# Patient Record
Sex: Male | Born: 1995 | Hispanic: No | Marital: Single
Health system: Northeastern US, Academic
[De-identification: ages and names within clinical notes are randomized; demographics above are authoritative.]

---

## 2019-03-28 ENCOUNTER — Other Ambulatory Visit: Payer: Self-pay | Admitting: Neurology

## 2019-03-28 DIAGNOSIS — R292 Abnormal reflex: Secondary | ICD-10-CM

## 2019-04-12 ENCOUNTER — Other Ambulatory Visit: Payer: Self-pay

## 2019-04-12 ENCOUNTER — Ambulatory Visit: Payer: BLUE CROSS/BLUE SHIELD

## 2019-04-12 ENCOUNTER — Ambulatory Visit
Admission: RE | Admit: 2019-04-12 | Discharge: 2019-04-12 | Disposition: A | Discharge status: Home / Self Care | Payer: BLUE CROSS/BLUE SHIELD | Source: Ambulatory Visit | Attending: Neurology | Admitting: Neurology

## 2019-04-12 DIAGNOSIS — R292 Abnormal reflex: Secondary | ICD-10-CM | POA: Diagnosis not present

## 2019-05-16 ENCOUNTER — Ambulatory Visit: Payer: BLUE CROSS/BLUE SHIELD

## 2019-05-29 ENCOUNTER — Other Ambulatory Visit: Payer: Self-pay

## 2019-05-29 ENCOUNTER — Ambulatory Visit
Admission: RE | Admit: 2019-05-29 | Discharge: 2019-05-29 | Disposition: A | Discharge status: Home / Self Care | Payer: BLUE CROSS/BLUE SHIELD | Source: Ambulatory Visit | Attending: Neurology | Admitting: Neurology

## 2019-05-29 DIAGNOSIS — R292 Abnormal reflex: Secondary | ICD-10-CM

## 2019-09-05 ENCOUNTER — Other Ambulatory Visit: Payer: Self-pay | Admitting: Neurology

## 2019-09-05 DIAGNOSIS — M5416 Radiculopathy, lumbar region: Secondary | ICD-10-CM

## 2019-09-11 ENCOUNTER — Other Ambulatory Visit: Payer: Self-pay

## 2019-09-11 ENCOUNTER — Ambulatory Visit
Admission: RE | Admit: 2019-09-11 | Discharge: 2019-09-11 | Disposition: A | Discharge status: Home / Self Care | Payer: BLUE CROSS/BLUE SHIELD | Source: Ambulatory Visit | Attending: Neurology | Admitting: Neurology

## 2019-09-11 DIAGNOSIS — M5416 Radiculopathy, lumbar region: Secondary | ICD-10-CM | POA: Diagnosis not present

## 2019-09-22 ENCOUNTER — Other Ambulatory Visit: Payer: Self-pay | Admitting: Rheumatology

## 2019-09-22 DIAGNOSIS — D869 Sarcoidosis, unspecified: Secondary | ICD-10-CM

## 2019-10-03 ENCOUNTER — Ambulatory Visit
Admission: RE | Admit: 2019-10-03 | Discharge: 2019-10-03 | Disposition: A | Discharge status: Home / Self Care | Payer: BLUE CROSS/BLUE SHIELD | Source: Ambulatory Visit | Attending: Rheumatology | Admitting: Rheumatology

## 2019-10-03 ENCOUNTER — Other Ambulatory Visit: Payer: Self-pay

## 2019-10-03 DIAGNOSIS — D869 Sarcoidosis, unspecified: Secondary | ICD-10-CM | POA: Diagnosis not present

## 2020-04-16 ENCOUNTER — Other Ambulatory Visit: Payer: Self-pay

## 2020-04-16 ENCOUNTER — Encounter: Payer: Self-pay | Admitting: Emergency Medicine

## 2020-04-16 ENCOUNTER — Emergency Department
Admission: EM | Admit: 2020-04-16 | Discharge: 2020-04-16 | Disposition: A | Discharge status: Home / Self Care | Payer: BLUE CROSS/BLUE SHIELD | Attending: Emergency Medicine | Admitting: Emergency Medicine

## 2020-04-16 DIAGNOSIS — R05 Cough: Secondary | ICD-10-CM | POA: Insufficient documentation

## 2020-04-16 DIAGNOSIS — B349 Viral infection, unspecified: Secondary | ICD-10-CM | POA: Diagnosis not present

## 2020-04-16 DIAGNOSIS — R0981 Nasal congestion: Secondary | ICD-10-CM | POA: Diagnosis not present

## 2020-04-16 DIAGNOSIS — Z20822 Contact with and (suspected) exposure to covid-19: Secondary | ICD-10-CM | POA: Diagnosis not present

## 2020-04-16 DIAGNOSIS — J029 Acute pharyngitis, unspecified: Secondary | ICD-10-CM | POA: Diagnosis present

## 2020-04-16 DIAGNOSIS — Z1152 Encounter for screening for COVID-19: Secondary | ICD-10-CM

## 2020-04-16 LAB — RESPIRATORY PANEL BY RT PCR (FLU A&B, COVID)
Influenza A by PCR: NEGATIVE
Influenza B by PCR: NEGATIVE
SARS Coronavirus 2 by RT PCR: NEGATIVE

## 2020-04-16 MED ORDER — PSEUDOEPH-BROMPHEN-DM 30-2-10 MG/5ML PO SYRP: 5.0000 mL | ORAL_SOLUTION | Freq: Four times a day (QID) | ORAL | 0 refills | Status: AC | PRN

## 2020-04-16 NOTE — ED Provider Notes (Signed)
[facility] Emergency Department Provider Note  ____________________________________________  Time seen: Approximately 4:01 PM  I have reviewed the triage vital signs and the nursing notes.   HISTORY  Chief Complaint Sore Throat, Nasal Congestion, and Cough   HPI Lonnie Fuller is a 24 y.o. male presenting to the emergency department for treatment and evaluation of congestion, sore throat and general malaise since yesterday. 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 No relief with over the counter medications prior to arrival.    History reviewed. No pertinent past medical history.  There are no problems to display for this patient.   History reviewed. No pertinent surgical history.  Prior to Admission medications   Medication Sig Start Date End Date Taking? Authorizing Provider  brompheniramine-pseudoephedrine-DM 30-2-10 MG/5ML syrup Take 5 mLs by mouth 4 (four) times daily as needed. 04/16/20   Chinita Pester, FNP    Allergies Patient has no allergy information on record.  No family history on file.  Social History Social History   Tobacco Use  . Smoking status: Not on file  Substance Use Topics  . Alcohol use: Not on file  . Drug use: Not on file    Review of Systems Constitutional: Positive for fever/chills.  Normal appetite. ENT: Positive for sore throat. Cardiovascular: Denies chest pain. Respiratory: No shortness of breath.  Positive for cough.  Negative wheezing.  Gastrointestinal: Negative for nausea, no vomiting.  No diarrhea.  Musculoskeletal: Positive for body aches Skin: Negative for rash. Neurological: Positive for headaches ____________________________________________   PHYSICAL EXAM:  VITAL SIGNS: ED Triage Vitals  Enc Vitals Group     BP 04/16/20 1558 126/66     Pulse Rate 04/16/20 1558 (!) 101     Resp 04/16/20 1558 20     Temp 04/16/20 1558 98.5 F (36.9 C)     Temp Source 04/16/20 1558 Oral      SpO2 04/16/20 1558 95 %     Weight 04/16/20 1557 163 lb (73.9 kg)     Height 04/16/20 1557 5\' 10"  (1.778 m)     Head Circumference --      Peak Flow --      Pain Score 04/16/20 1556 8     Pain Loc --      Pain Edu? --      Excl. in GC? --     Constitutional: Alert and oriented.  Overall well appearing and in no acute distress. Eyes: Conjunctivae are normal. Ears: TMs normal Nose: Maxillary sinus congestion noted; no rhinnorhea. Mouth/Throat: Mucous membranes are moist.  Oropharynx erythematous. Tonsils without exudate. Uvula midline. Neck: No stridor.  Lymphatic: No cervical lymphadenopathy. Cardiovascular: Normal rate, regular rhythm. Good peripheral circulation. Respiratory: Respirations are even and unlabored.  No retractions.  Breath sounds clear to auscultation. Gastrointestinal: Soft and nontender.  Musculoskeletal: FROM x 4 extremities.  Neurologic:  Normal speech and language. Skin:  Skin is warm, dry and intact. No rash noted. Psychiatric: Mood and affect are normal. Speech and behavior are normal.  ____________________________________________   LABS (all labs ordered are listed, but only abnormal results are displayed)  Labs Reviewed  RESPIRATORY PANEL BY RT PCR (FLU A&B, COVID)   ____________________________________________  EKG  Not indicated ____________________________________________  RADIOLOGY  Not indicated ____________________________________________   PROCEDURES  Procedure(s) performed: None  Critical Care performed: No ____________________________________________   INITIAL IMPRESSION / ASSESSMENT AND PLAN / ED COURSE  24 y.o. male presenting to the emergency department for treatment and evaluation of  symptoms as described in the HPI.  Plan will be to test him for COVID-19, give him a school excuse, and prescribe Bromfed.  He is to follow-up with the student health clinic for symptoms that are not improving over the next few days.  He is to  return to the emergency department for symptoms change or worsen if unable to schedule an appointment.    Medications - No data to display  ED Discharge Orders         Ordered    brompheniramine-pseudoephedrine-DM 30-2-10 MG/5ML syrup  4 times daily PRN        04/16/20 1606           Pertinent labs & imaging results that were available during my care of the patient were reviewed by me and considered in my medical decision making (see chart for details).    If controlled substance prescribed during this visit, 12 month history viewed on the [facility] prior to issuing an initial prescription for Schedule II or III opiod. ____________________________________________   FINAL CLINICAL IMPRESSION(S) / ED DIAGNOSES  Final diagnoses:  Acute viral syndrome  Encounter for screening for COVID-19    Note:  This document was prepared using Dragon voice recognition software and may include unintentional dictation errors.    Chinita Pester, FNP 04/16/20 2114    Minna Antis, MD 04/19/20 1442

## 2020-04-16 NOTE — ED Notes (Signed)
Pt verbalizes understanding of d/c instructions and follow up. 

## 2020-04-16 NOTE — ED Triage Notes (Signed)
Pt reports runny nose, sore throat and a litle cough since yesterday

## 2020-08-01 ENCOUNTER — Encounter: Admit: 2020-08-01 | Payer: PRIVATE HEALTH INSURANCE | Attending: Anesthesiology | Primary: Pediatrics

## 2020-08-03 ENCOUNTER — Inpatient Hospital Stay: Admit: 2020-08-03 | Discharge: 2020-08-03 | Payer: BLUE CROSS/BLUE SHIELD | Primary: Pediatrics

## 2020-08-03 ENCOUNTER — Encounter: Admit: 2020-08-03 | Payer: PRIVATE HEALTH INSURANCE | Attending: Anesthesiology | Primary: Pediatrics

## 2020-08-03 DIAGNOSIS — M47812 Spondylosis without myelopathy or radiculopathy, cervical region: Secondary | ICD-10-CM

## 2020-08-03 DIAGNOSIS — G894 Chronic pain syndrome: Secondary | ICD-10-CM

## 2020-08-03 DIAGNOSIS — M47816 Spondylosis without myelopathy or radiculopathy, lumbar region: Secondary | ICD-10-CM

## 2020-08-03 DIAGNOSIS — G629 Polyneuropathy, unspecified: Secondary | ICD-10-CM

## 2020-08-03 MED ORDER — TRAZODONE 50 MG TABLET
50 mg | Status: SS
Start: 2020-08-03 — End: 2020-09-02

## 2020-08-03 MED ORDER — DULOXETINE 30 MG CAPSULE,DELAYED RELEASE
30 mg | Freq: Every day | Status: SS
Start: 2020-08-03 — End: 2020-09-28

## 2020-08-03 MED ORDER — NALTREXONE (BULK) 100 % POWDER
100 % | 1 refills | Status: SS
Start: 2020-08-03 — End: 2020-09-02

## 2020-08-04 NOTE — Progress Notes
[facility] for Pain Management  [facility]    Progress Note    Referred by: No referring provider defined for this encounter.  Time of service: 1130-1225pm  Chief Complaint: back pain  Father present at interview     Interval HPI:  I last saw en about 1 year ago. Since then he has seen several specialists in the area including [place] including Pain management, neurology, psychology/psychiatrist, Rheum, Genetics, ENT/Pul. He's had a lot of workup which essentially has been inconclusive. 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 He seems to have OSA which is impacting his sleep, mood and pain for which he will have ENT surgery to help resolve that. He was encouraged to continue cymbalta and Lyrica from his specialists at [place]. 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 Travis Conway's pain continues to get worse - he has whole body pain, in all major joints and it feels numb. His legs don't work when he walks. Cymbalta was restarted as it may have helped his mood although Travis Conway denies this. I discussed that Travis Conway should continue Cymbalta and titrate up as tolerated. I discussed going to licensed medical dispensary in [place] as MJ seems to help him. I offered trying Low dose Naltrexone for his chronic pain. In addition I offered lumbar epidural and MBB given that he has a disc protrusion in the back with the hopes to decrease some of the pain he has while his diagnostic workup continues otherwise.     Initial HPI  Travis Conway is a 24 y.o. male with PMHx of digeorge syndrome who presents for initial evaluation for chronic whole back pain. Patient was referred over from Dr. Noel Gerold from [facility]. Father present during interview. As per patient, pain became an issue about 2 years ago but thinks it gradually got worse from way longer than that. No inciting event, involves neck, upper back, mid thoracic and lumbar back. Also with knee, foot pain and sometimes arm pain. Over the years the pain burden has worsened. He has kept active during this time and does HEP/PT. His PCP did a slew of labs which all came back normal. He has a history of scoliosis. He referred to several orthopedists and neurologists with MRI done showing no gross abnormality. He denies headache, GI symptoms, fatigue but did have intentional weight loss of over 30 lbs over the last few years which his PCP knows about. 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 Denies radicular symptoms. Aside from PT, tried occasional Motrin and smokes MJ and vapes.       The patient denies bowel/bladder changes of retention or urgency  The patient denies numbness, denies tingling, and denies weakness.    PAIN DETAIL:  Onset: 2 years  Inciting Event: no If yes, Date of injury: no  Progression: since onset, pain has worsened   Timing: constant   Quality: ()Ache ()Burning  Radiating: upper back  Severity: 4-6/10  Associated numbness or weakness: no  Is Patient taking Opioid Medication:no  Exacerbating factors: activity   Alleviating factors: rest   Is Pain Level Acceptable?:     Current pain medications:  Cymbalta    Pain Relief Treatment Hx:   Medications:  Lyrica  Cymbalta  Muscle relaxant  Tramadol  NSAID    Interventions:  none  Adjuvants:  PT/HEP  Acupuncture     PAST MEDICAL HISTORY:  DiGeorge Syndrome    Past Psychiatric history  ADHD    PAST SURGICAL HISTORY:  No past surgical history on file.  No past surgical history pertinent negatives on file.    SOCIAL HISTORY:  Social History     Socioeconomic History   ? Marital status: Single     Spouse name: Not on file   ? Number of children: Not on file   ? Years of education: Not on file   ? Highest education level: Not on file   Occupational History   ? Not on file   Tobacco Use   ? Smoking status: Never Smoker   Substance and Sexual Activity   ? Alcohol use: Not on file Comment: occ   ? Drug use: Never   ? Sexual activity: Not on file   Other Topics Concern   ? Not on file   Social History Narrative   ? Not on file     Social Determinants of Health     Financial Resource Strain:    ? Difficulty of Paying Living Expenses:    Food Insecurity:    ? Worried About Programme researcher, broadcasting/film/video in the Last Year:    ? Barista in the Last Year:    Transportation Needs:    ? Freight forwarder (Medical):    ? Lack of Transportation (Non-Medical):    Physical Activity:    ? Days of Exercise per Week:    ? Minutes of Exercise per Session:    Stress:    ? Feeling of Stress :    Social Connections:    ? Frequency of Communication with Friends and Family:    ? Frequency of Social Gatherings with Friends and Family:    ? Attends Religious Services:    ? Active Member of Clubs or Organizations:    ? Attends Banker Meetings:    ? Marital Status:    Intimate Partner Violence:    ? Fear of Current or Ex-Partner:    ? Emotionally Abused:    ? Physically Abused:    ? Sexually Abused:      Social History     Social History Narrative   ? Not on file      Denies a previous problem with ETOH and/or Drugs.    FAMILY HISTORY:  History reviewed. No pertinent family history.  Reviewed, no history of chronic pain in parents, and is non-contributory    REVIEW OF SYSTEMS:  Constitutional: intentional weight loss   HEENT: +fatigue  Cardiovascular: Negative for chest pain or palpitations   Respiratory: Negative for cough, wheezing, dyspnea   Gastrointestinal: Negative for nausea, vomiting, constipation, diarrhea, or heartburn  Genitourinary: Negative for dysuria   Musculoskeletal: body aches   Skin: Negative for rashes   Neurologic: Negative for headache, paresthesias, numbness, focal weakness  Psychiatric: Negative for depression/anxiety; No suicidal ideation; No insomnia  Endocrine: Negative for polydipsia, cold/heat intolerance   Hematologic: Negative for spontaneous bruising    Physical Exam  There were no vitals filed for this visit.  There is no height or weight on file to calculate BMI.    Gen: NAD  Psych: mood appropriate for given condition  CV: 2+ radial pulse  HEENT: anicteric   Respiratory: non labored  Abd: soft nt, nd  Lymph: no lymphadenopathy in cervical or axillary regions  Skin: intact  Sensation: intact to lt  touch bilaterally in c4-t1   Reflexes: 2+ BR, Bicep, tricept  ROM: Cervical ROM full, shoulder, elbow and wrist ROM full, no scapular dysmotility   Tone:  Normal at elbow, wrist and shoulder   Inspection: no atrophy of bicep, FDI or APB noted, no scapular winging  Special tests: Kemp's positive bilaterally   Neer's Tinnel's negative at the wrist over median nerve.   Palpation: tender cervical paraspinals, levator scapula and trapezius non tender bicipital tendon  Motor:                  Right  Left  C4 Shoulder Abduction    5  5  C5 Elbow Flexion      5  5  C6 Wrist Extension    5  5  C7 Elbow Extension     5  5  C8/T1 Hand Intrinsics     5  5  C8 First Dorsal Interosseus  5  5  C8 Abductor Pollicus Brevis  5  5  C5/6 Shoulder ER   4 4    ROM: Ful of the L spine in all planes worst in extension, full ROM at ankles, knees and hips  Sensation: intact to light touch in all dermatomes tested from L2-S1 bilaterally  Reflexes: 2+ b/l patella and Achilles, biceps and triceps, plantar response down going   Palpation: Diffusely tender over lumbar paraspinals  -TTP over the b/l greater trochanters and bilateral SI joint  Tone: normal in the b/l knees and hips   Lymph: no lymphadenopathy at groin or popliteal fossa  Skin: intact  Extremities: No edema in b/l ankles or hands  Provacative tests: pos Marden Noble (rotation and extension)   Lumbar scoliosis appreciated     Imaging   C Spine 05/2019  IMPRESSION:  1. Mildly dysplastic C2 vertebra. Mild dextroconvex cervical  scoliosis but preserved lordosis.  2. Minor cervical spine degeneration. No disc herniation or spinal  stenosis. There is mild right C5 foraminal stenosis.    Assessment  Travis Conway is a 24 y.o. 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 I had a long discussion with patient and this father. His initial pain has evolved to include whole body numbness which is effecting his mood and daily functionality. 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 He is getting PSA surgery soon which will help with getting more restful sleep improve his mood and pain perception. Currently he is on Cymbalta and I offered to try low dose naltrexone. I offered Lumbar epidural give his MRI finding of disc protrusion but did tell Travis Conway that if he feel numb all over this won't necessarily help with that. Still willing to try.     Chronic pain syndrome  Neuropathy   Cervical Spondylosis   Lumbar Spondylosis  Scoliosis  Hypermobility syndrome  Myofascial pain      Plan  #Interventional:  -Will schedule for caudal epidural without sedation.  -Consider MBB   -Outside pain doc offered ketamine gtt - deferred for now  -Getting OSa surgery with ENT    #Medication:  -Trial Low dose Naltrexone   -Back on Cymbalta  -trazodone for sleep  -d/c Mobic 15mg  qd  -d/c tizanidine 2mg  q8 hours prn  -d/cflexeril 5mg  q8 hours prn      #Adjuvants:   -  signed up for [facility] as [facility] helps   -Tried Acupuncture- some relief  -cut down on nicotine   -encouraged HEP if not Physical therapy (made pain worse)    - Follow-up: prn    Frederich Cha, MD  08/04/2020    [facility] for [facility] Management  [address] [place] [address]  [phone]  Pager: [phone]

## 2020-08-10 ENCOUNTER — Encounter: Admit: 2020-08-10 | Payer: PRIVATE HEALTH INSURANCE | Primary: Pediatrics

## 2020-08-10 DIAGNOSIS — Z01818 Encounter for other preprocedural examination: Secondary | ICD-10-CM

## 2020-08-17 ENCOUNTER — Inpatient Hospital Stay: Admit: 2020-08-17 | Discharge: 2020-08-17 | Payer: BLUE CROSS/BLUE SHIELD | Primary: Pediatrics

## 2020-08-17 DIAGNOSIS — U071 COVID-19: Secondary | ICD-10-CM

## 2020-08-17 DIAGNOSIS — Z01818 Encounter for other preprocedural examination: Secondary | ICD-10-CM

## 2020-08-17 DIAGNOSIS — Z01812 Encounter for preprocedural laboratory examination: Secondary | ICD-10-CM

## 2020-08-18 LAB — COVID-19 CLEARANCE OR FOR PLACEMENT ONLY: BKR SARS-COV-2 RNA (COVID-19) (YH): POSITIVE — AB

## 2020-08-24 ENCOUNTER — Encounter: Admit: 2020-08-24 | Payer: PRIVATE HEALTH INSURANCE | Attending: Anesthesiology | Primary: Pediatrics

## 2020-08-24 DIAGNOSIS — Z01818 Encounter for other preprocedural examination: Secondary | ICD-10-CM

## 2020-09-02 ENCOUNTER — Inpatient Hospital Stay: Admit: 2020-09-02 | Discharge: 2020-09-02 | Payer: BLUE CROSS/BLUE SHIELD | Primary: Pediatrics

## 2020-09-02 ENCOUNTER — Encounter: Admit: 2020-09-02 | Payer: PRIVATE HEALTH INSURANCE | Attending: Anesthesiology | Primary: Pediatrics

## 2020-09-02 ENCOUNTER — Ambulatory Visit: Admit: 2020-09-02 | Payer: PRIVATE HEALTH INSURANCE | Primary: Pediatrics

## 2020-09-02 DIAGNOSIS — M5416 Radiculopathy, lumbar region: Secondary | ICD-10-CM

## 2020-09-02 MED ORDER — FENTANYL (PF) 50 MCG/ML INJECTION SOLUTION
50 mcg/mL | INTRAVENOUS | Status: DC | PRN
Start: 2020-09-02 — End: 2020-09-02

## 2020-09-02 MED ORDER — IOHEXOL 240 MG IODINE/ML INTRAVENOUS SOLUTION
240 mg iodine/mL | Freq: Once | EPIDURAL | Status: CP
Start: 2020-09-02 — End: ?
  Administered 2020-09-02: 19:00:00 240 mL via EPIDURAL

## 2020-09-02 MED ORDER — DEXAMETHASONE SODIUM PHOSPHATE (PF) 10 MG/ML INJECTION SOLUTION
10 mg/mL | Freq: Once | EPIDURAL | Status: DC
Start: 2020-09-02 — End: 2020-09-02

## 2020-09-02 MED ORDER — LACTATED RINGERS INTRAVENOUS SOLUTION
INTRAVENOUS | Status: DC
Start: 2020-09-02 — End: 2020-09-02

## 2020-09-02 MED ORDER — SODIUM BICARBONATE 1 MEQ/ML (8.4 %) INTRAVENOUS SOLUTION
8.4 % | Status: CP
Start: 2020-09-02 — End: ?

## 2020-09-02 MED ORDER — BUPIVACAINE (PF) 0.25 % (2.5 MG/ML) INJECTION SOLUTION
0.25 % (2.5 mg/mL) | Freq: Once | EPIDURAL | Status: CP
Start: 2020-09-02 — End: ?
  Administered 2020-09-02: 19:00:00 0.25 mL via EPIDURAL

## 2020-09-02 MED ORDER — METHYLPREDNISOLONE ACETATE 40 MG/ML SUSPENSION FOR INJECTION
40 mg/mL | Freq: Once | EPIDURAL | Status: CP
Start: 2020-09-02 — End: ?
  Administered 2020-09-02: 19:00:00 40 mL via EPIDURAL

## 2020-09-02 MED ORDER — MIDAZOLAM (PF) 1 MG/ML INJECTION SOLUTION
1 mg/mL | INTRAVENOUS | Status: DC | PRN
Start: 2020-09-02 — End: 2020-09-02

## 2020-09-02 NOTE — Discharge Instructions
[facility] ---------------------------------------------------------------------------------------------------------- Interventional and Cancer Pain Management Post-Procedure Instructions You must bring a family member or friend with you as you will need someone to drive you home from the hospital. If you do not have someone available, you must notify us. The physician reserves the right to cancel the procedure if you do not have a ride. What to Expect:For diagnostic blocks, your pain relief coverage is determined immediately after your procedure. You will be given a pain diary. Please record your % pain relief for the first six (6) hours after the procedure. Please use that form to rate the improvement in your usual pain and disregard any soreness from the injection. You may experience an increase in your usual pain and/or soreness at the injection site for the first few days following the procedure.  The relief from a therapeutic injection may take 3-5 days for full effect and radiofrequency ablation may take up to 4 weeks before your maximum benefit is reached.  Please note if a steroid was used, it may cause some facial flushing (redness) or puffiness as a side effect. If it occurs, it is temporary and will subside. Other steroid side effects may include: water retention, headache, changes in menstrual flow, mood changes, sweats, insomnia, and restlessness. These effects are usually mild and short in duration, rarely lasting beyond one or two weeks. This is NOT considered an allergic reaction. If you are diabetic, the steroids may temporarily increase your blood sugar levels. If this occurs, please notify your Primary Care Physician. Your diabetes medication may need to be adjusted. For celiac plexus neurolysis, you may experience loose bowel movements for the first week after the procedure. Please stay hydrated and continue a well-balanced diet. Your comfort is important to [place]. Please call if you developed any of the following problems: soreness, redness, drainage or warmth where the injection was done, severe persistent headache, fever/chills, increased severe pain, new numbness/weakness, or change in level of consciousness or changes in bladder or bowel function. ? WEEKDAYS: Please call the Pain Center if you have any questions or concerns at [phone] during normal business hours. ? AFTER HOURS AND WEEKENDS: As always, if you are having a pain management emergency, please call the [facility] Operator at [phone] and ask that they page the anesthesiologist on call. ? If for any reason, you are unable to reach a physician from the pain medicine center or the anesthesiologist on call and you are having any of the problems listed above, please go to the nearest emergency room and show them this document. ------------------------------------------------------------------------------------------------------------ Activity and Showering: For diagnostic blocks, record improvement of your usual pain with normal activity during your first six (6) hours immediately after the procedure. Disregard any soreness from the injection while completing your pain diary. For therapeutic procedures, rest today and take it easy. Stay off your feet as much as possible today. No strenuous exercise, heavy lifting or labor-intensive activities for today.  For patients undergoing intrathecal pump or spinal cord stimulator implant, no strenuous activity, bending or extreme flexion until deemed appropriate after follow-up (minimum 2-4 weeks). For injections, you may resume normal activities, as tolerated tomorrow, but avoid strenuous activity for a few days. You may remove the Band-Aid dressing and shower tonight or tomorrow. No submerging in water (bath tub, swimming, Jacuzzi, etc.) for 24 hours after the procedure. For patients undergoing intrathecal pump or spinal cord stimulator implant, no strenuous activity, bending or extreme flexion until deemed appropriate  after follow-up (minimum 2-4 weeks). You will be discharged home with an abdominal binder that should be continued until follow-up appointment. Sponge bath only. Maintain incision and dressing area clean, dry, and intact until follow-up appointment, when your healing progress will be evaluated. Medications and Diet: Use Tylenol (not aspirin) or your pain medication for discomfort at the injection site. Aspirin can cause bleeding. You may also use ice for soreness over the injection site (with at least a paper towel between the ice and your skin). Apply the ice for 30 minutes, then leave off for 30 minutes - repeat this cycle as necessary. For restarting anticoagulation and antiplatelet medications, please follow provided ASRA anticoagulation guidelines (attachment). You may resume taking any medications you stopped prior to the procedure. You may continue to take your pain medications as prescribed. You may continue with your regular diet. For patients undergoing intrathecal pump or spinal cord stimulator implant, you will be discharged home with a prescription for antibiotics for seven (7) days - please complete this course of antibiotics to minimize infection risk. ------------------------------------------------------------------------------------------------------------------Follow-up Post-Procedure: A nurse from our office will call you in approximately 1-5 days to follow-up on your response to the procedure. (if you do not hear from [place], please feel free to call us at [phone]. You are expected to follow-up within two (2) weeks after your injection. Please note your follow-up appointment date and time if scheduled as written on your pre-procedure instructions. If you do not have a follow-up appointment scheduled with your discharge, please call [phone] or [phone] to schedule. ----------------------------------------------------------------------------------------------------------------Bunnie Philips, RNInterventional and Cancer Pain Management [facility] [address] [place], [place] [address] Tel: [phone] Fax: [phone]  [facility] [address] [place], [place] [address] Tel: [phone]: [phone]

## 2020-09-02 NOTE — Other
Returned from procedure ambulating with Air cabin crew. Patient is alert and conversant, states same pain pre procedure. Primapore dressing to mid lower back, dry and intact.  Did not receive sedation. Dressed self.  Received discharge instructions from RN Alyssa. Discharged home with father. Ambulated off the floor.

## 2020-09-02 NOTE — Procedures
Procedure NoteDate of procedure: 09/02/2020 Procedure:                              Caudal epidural steroid injection under fluoroscopy                                                Fluoroscopic needle guidance                                               Preoperative Diagnosis:         Lumbar radiculopathy                                                Postoperative Diagnosis:        Lumbar radiculopathy                                                 Surgeon: Frederich Cha, MD  Anesthesia: Moderate sedation EBL: ZeroFindings: NoneSpecimen: None After risks and benefits were explained including bleeding, infection, worsening of the pain, damage to the area being injected, nerve damage, spinal cord damage, paralysis, meningitis, arachnoiditis, allergic reaction to medication and spinal headache, all questions were answered and a signed consent was obtained.   Next, the patient was brought to the procedure room and placed in the prone position. Standard ASA monitors were placed. Support pillows were placed under the head and the abdomen. The back and sacrococcygeal region were prepped with chlorhexidine and draped in the usual sterile fashion and sterile technique was adhered to throughout the entire procedure. The skin and subcutaneous tissue overlying the sacrococcygeal ligament was anesthetized with 3 cc of 1% lidocaine. Using fluoroscopic guidance in the lateral and AP projection, a #25 gauge quincke needle was advanced through the sacrococcygeal ligament into the caudal canal. This was accomplished easily. There was no evidence of CSF, paresthesia, or heme. 1 cc of omnipaque 240 mg/ml was given confirming placement of the needle in the epidural space and with contrast spread up to L5/S1. The patient then received a mixture of 80 mg of depo-medrol, 8 cc of PF Saline lidocaine through the needle. There was no evidence of CSF, paresthesia or heme. The needle was then removed. A Band-Aid was applied over the site. After the procedure, the patient was brought back to the recovery area in stable condition. The patient tolerated the procedure well. There was no neurological deficits post procedure. The patient went to the recovery room in stable condition. Discharge instructions were given to the patient. (BL L5-S1 MBB)Cashtyn Pouliot, MD was present for the entire procedure. Total fluoroscopic time: 12 seconds

## 2020-09-02 NOTE — Other
Subsequent to this admission for the Caudal epidural procedure, I have personally reassessed the patient by examination, review of relevant data pertaining to the planned procedure. I have verified the planned procedure and there have been no relevant changes since the last history and physical which was less than 30 days ago.Physical Exam:Constitutional: The patient is oriented to person, place, and time. The patient appears well-developed and well-nourished. HENT: Head: Normocephalic and atraumatic. Eyes: EOM are normal. Pupils are equal, roundNeck: Normal range of motion. Neck supple. Pulmonary/Chest: Effort normal, CTABCVS: +S1,S2, RRRAbdominal: Soft. Neurological: The patient is alert and oriented to person, place, and time. Skin: Skin is warm and dry. Psychiatric: The patient has a normal mood and affect. ANESTHESIA HISTORYNo personal or significant family history of anesthesia problemsASA Status: ASA 1: Healthy patient with no illness other than that requiring procedureMALLAMPATI SCOREClass 2: Upper half of tonsil fossa visibleElectronically Signed by Frederich Cha, MD, September 02, 2020

## 2020-09-02 NOTE — Anesthesia Procedure Notes
1:48 PM Procedure : Caudal Steroid Injection Diagnosis: DDD, DJD Lumbar SpinePrep: PHISOHEX/ALCOHOLSite: CAUDALFluoroscope Used?: Yes Position: PronePositional Aids: Pillows   Dressing: Dry Sterile DressingInstructions for follow up care giving to patient? Eston Esters by: Frederich Cha, MD

## 2020-09-02 NOTE — Other
Patient/Family acknowledge understanding of fall prevention education including to call nurse with assistance with ambulation

## 2020-09-02 NOTE — Other
Two nurse verification of: patient identification, site marking and consent.  Reviewed with pain procedure RN.

## 2020-09-02 NOTE — Discharge Summary
PAIN MANAGEMENT DISCHARGE NOTEBenjamin Conway is status post Procedure : Caudal Steroid Injection. Travis Conway tolerated procedure well and is being discharged to home.Frederich Cha, MD1/20/2022

## 2020-09-03 NOTE — Telephone Encounter
Patient unavailable. Left message with call back number.

## 2020-09-21 ENCOUNTER — Inpatient Hospital Stay: Admit: 2020-09-21 | Discharge: 2020-09-21 | Payer: BLUE CROSS/BLUE SHIELD | Primary: Pediatrics

## 2020-09-21 DIAGNOSIS — M545 Low back pain, unspecified: Secondary | ICD-10-CM

## 2020-09-21 NOTE — Progress Notes
[facility]  [facility]    Phone Progress Note    Referred by: No referring provider defined for this encounter.  Time of service: 12pm  Chief Complaint: back pain  Family present at phone interview     Interval HPI:  [name] had a caudal epidural done Sep 02 2018. He felt he had 1-2 days of relief - not just lumbar back pain but whole body relief. Discussed that maybe his underlying process responds favorably to sterioids and that we should do a repeat injection and see if he gets relief again. If so, I will relay this information to this other doctors, especially Dr. Ashley Jacobs (rheum). He also wasn't able to consistently try the low dose naltrexone. Discussed worth finishing the remaining amount as studies show positive anti inflammatory benefits with glial cells and chronic pain states.     Interval HPI 08/2020:  I last saw en about 1 year ago. Since then he has seen several specialists in the area including [place] including Pain management, neurology, psychology/psychiatrist, Rheum, Genetics, ENT/Pul. He's had a lot of workup which essentially has been inconclusive. 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 He seems to have OSA which is impacting his sleep, mood and pain for which he will have ENT surgery to help resolve that. He was encouraged to continue cymbalta and Lyrica from his specialists at [place]. 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 Ben's pain continues to get worse - he has whole body pain, in all major joints and it feels numb. His legs don't work when he walks. Cymbalta was restarted as it may have helped his mood although [name] denies this. I discussed that [name] should continue Cymbalta and titrate up as tolerated. I discussed going to licensed medical dispensary in [place] as MJ seems to help him. I offered trying Low dose Naltrexone for his chronic pain. In addition I offered lumbar epidural and MBB given that he has a disc protrusion in the back with the hopes to decrease some of the pain he has while his diagnostic workup continues otherwise.     Initial HPI  Travis Conway is a 25 y.o. male with PMHx of digeorge syndrome who presents for initial evaluation for chronic whole back pain. Patient was referred over from Dr. Noel Gerold from [facility]. Father present during interview. As per patient, pain became an issue about 2 years ago but thinks it gradually got worse from way longer than that. No inciting event, involves neck, upper back, mid thoracic and lumbar back. Also with knee, foot pain and sometimes arm pain. Over the years the pain burden has worsened. He has kept active during this time and does HEP/PT. His PCP did a slew of labs which all came back normal. He has a history of scoliosis. He referred to several orthopedists and neurologists with MRI done showing no gross abnormality. He denies headache, GI symptoms, fatigue but did have intentional weight loss of over 30 lbs over the last few years which his PCP knows about. 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 Denies radicular symptoms. Aside from PT, tried occasional Motrin and smokes MJ and vapes.       The patient denies bowel/bladder changes of retention or urgency  The patient denies numbness, denies tingling, and denies weakness.  PAIN DETAIL:  Onset: 2 years  Inciting Event: no If yes, Date of injury: no  Progression: since onset, pain has worsened   Timing: constant   Quality: ()Ache ()Burning  Radiating: upper back  Severity: 4-6/10  Associated numbness or weakness: no  Is Patient taking Opioid Medication:no  Exacerbating factors: activity   Alleviating factors: rest   Is Pain Level Acceptable?:     Current pain medications:  Cymbalta    Pain Relief Treatment Hx:   Medications:  Lyrica  Cymbalta  Muscle relaxant  Tramadol  NSAID    Interventions:  Caudal epidural - 08/2020    Adjuvants:  PT/HEP  Acupuncture     PAST MEDICAL HISTORY:  DiGeorge Syndrome    Past Psychiatric history  ADHD    PAST SURGICAL HISTORY:  No past surgical history on file.  No past surgical history pertinent negatives on file.    SOCIAL HISTORY:  Social History     Socioeconomic History   ? Marital status: Single     Spouse name: Not on file   ? Number of children: Not on file   ? Years of education: Not on file   ? Highest education level: Not on file   Occupational History   ? Not on file   Tobacco Use   ? Smoking status: Never Smoker   Substance and Sexual Activity   ? Alcohol use: Not on file     Comment: occ   ? Drug use: Never   ? Sexual activity: Not on file   Other Topics Concern   ? Not on file   Social History Narrative   ? Not on file     Social Determinants of Health     Financial Resource Strain:    ? Difficulty of Paying Living Expenses:    Food Insecurity:    ? Worried About Programme researcher, broadcasting/film/video in the Last Year:    ? Barista in the Last Year:    Transportation Needs:    ? Freight forwarder (Medical):    ? Lack of Transportation (Non-Medical):    Physical Activity:    ? Days of Exercise per Week:    ? Minutes of Exercise per Session:    Stress:    ? Feeling of Stress :    Social Connections:    ? Frequency of Communication with Friends and Family:    ? Frequency of Social Gatherings with Friends and Family:    ? Attends Religious Services:    ? Active Member of Clubs or Organizations:    ? Attends Banker Meetings:    ? Marital Status:    Intimate Partner Violence:    ? Fear of Current or Ex-Partner:    ? Emotionally Abused:    ? Physically Abused:    ? Sexually Abused:      Social History     Social History Narrative   ? Not on file      Denies a previous problem with ETOH and/or Drugs.    FAMILY HISTORY:  No family history on file.  Reviewed, no history of chronic pain in parents, and is non-contributory    REVIEW OF SYSTEMS:  Constitutional: intentional weight loss   HEENT: +fatigue  Cardiovascular: Negative for chest pain or palpitations   Respiratory: Negative for cough, wheezing, dyspnea   Gastrointestinal: Negative for nausea, vomiting, constipation, diarrhea, or heartburn  Genitourinary: Negative for dysuria   Musculoskeletal: body aches  Skin: Negative for rashes   Neurologic: Negative for headache, paresthesias, numbness, focal weakness  Psychiatric: Negative for depression/anxiety; No suicidal ideation; No insomnia  Endocrine: Negative for polydipsia, cold/heat intolerance   Hematologic: Negative for spontaneous bruising    Physical Exam  There were no vitals filed for this visit.  There is no height or weight on file to calculate BMI.    Gen: NAD  Psych: mood appropriate for given condition  CV: 2+ radial pulse  HEENT: anicteric   Respiratory: non labored  Abd: soft nt, nd  Lymph: no lymphadenopathy in cervical or axillary regions  Skin: intact  Sensation: intact to lt touch bilaterally in c4-t1   Reflexes: 2+ BR, Bicep, tricept  ROM: Cervical ROM full, shoulder, elbow and wrist ROM full, no scapular dysmotility   Tone:  Normal at elbow, wrist and shoulder   Inspection: no atrophy of bicep, FDI or APB noted, no scapular winging  Special tests: Kemp's positive bilaterally   Neer's Tinnel's negative at the wrist over median nerve.   Palpation: tender cervical paraspinals, levator scapula and trapezius non tender bicipital tendon  Motor:                  Right  Left  C4 Shoulder Abduction    5  5  C5 Elbow Flexion      5  5  C6 Wrist Extension    5  5  C7 Elbow Extension     5  5  C8/T1 Hand Intrinsics     5  5  C8 First Dorsal Interosseus  5  5  C8 Abductor Pollicus Brevis  5  5  C5/6 Shoulder ER   4 4    ROM: Ful of the L spine in all planes worst in extension, full ROM at ankles, knees and hips  Sensation: intact to light touch in all dermatomes tested from L2-S1 bilaterally  Reflexes: 2+ b/l patella and Achilles, biceps and triceps, plantar response down going Palpation: Diffusely tender over lumbar paraspinals  -TTP over the b/l greater trochanters and bilateral SI joint  Tone: normal in the b/l knees and hips   Lymph: no lymphadenopathy at groin or popliteal fossa  Skin: intact  Extremities: No edema in b/l ankles or hands  Provacative tests: pos Marden Noble (rotation and extension)   Lumbar scoliosis appreciated     Imaging   C Spine 05/2019  IMPRESSION:  1. Mildly dysplastic C2 vertebra. Mild dextroconvex cervical  scoliosis but preserved lordosis.  2. Minor cervical spine degeneration. No disc herniation or spinal  stenosis. There is mild right C5 foraminal stenosis.    Assessment  Travis Conway is a 25 y.o. 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 I had a long discussion with patient and this father. His initial pain has evolved to include whole body numbness which is effecting his mood and daily functionality. 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 He is getting PSA surgery soon which will help with getting more restful sleep improve his mood and pain perception. Currently he is on Cymbalta and I offered to try low dose naltrexone. I offered Lumbar epidural give his MRI finding of disc protrusion but did tell [name] that if he feel numb all over this won't necessarily help with that. Still willing  to try.     Chronic pain syndrome  Neuropathy   Cervical Spondylosis   Lumbar Spondylosis  Scoliosis  Hypermobility syndrome  Myofascial pain      Plan  #Interventional:  -Will schedule for repeat caudal epidural without sedation. Risks/benefits of interventional treatment discussed in detail including, but not limited to, infection, bleeding, nerve damage, increased pain, paralysis, loss of bowel/bladder control, and death.  -Consider MBB -Outside pain doc offered ketamine gtt - deferred for now  -Getting OSA surgery with ENT    #Medication:  -Trial Low dose Naltrexone   -Back on Cymbalta  -trazodone for sleep  -d/c Mobic 15mg  qd  -d/c tizanidine 2mg  q8 hours prn  -d/cflexeril 5mg  q8 hours prn      #Adjuvants:   -signed up for [facility] as [facility] helps   -Tried Acupuncture- some relief  -cut down on nicotine   -encouraged HEP if not Physical therapy (made pain worse)    - Follow-up: prn    Frederich Cha, MD  09/21/2020    [facility]  [address] [place] [address] [phone]  Pager: [phone]    Indications for this procedure for this specific patient include the following     - Injections being provided as part of a comprehensive pain management program.    - Pt has failed 6 weeks of conservative therapy including but not limited to oral meds, PT, spinal manipulation, chiropractic care, cognitive behavioral therapy and or home exercise program which has been discussed with the patient   - Injection being provided for suspected radicular pain.    - Pain scale of greater than or equal to 3/10 with functional impairment  - No evidence of local or systemic infection, bleeding tendency or unstable medical condition.    - Pain is causing significant functional limitation resulting in diminished quality of life and impaired age appropriate ADL's.   - repeat injections are done no sooner than 7 days after the previous injection  - epidural done for suspected radicular pain along dermatome of nerve   - epidural done to differentiate level of radicular nerve root pain   - repeat injections done only when pt reports 50% improvement in pain from previous injections    - Injection done at caudal level(s) which is consistent with patient's dermatomal pain complaint          - patient does not have major risk factors for spinal cancer, e.g. LBP with fever) or, if cancer is present, but the pain is clearly unrelated      - there is no co-existing medical or other condition that precludes the safe performance of the procedure, e.g. New onset of LBP with fever, risk factors for, or signs of, cauda equina syndrome, rapidly progressing (or other) neurological deficits      - numbness and/or weakness documented is associated with paresthesia/dysesthesia or pain  - Pain associated with  - Herpes Zoster and/or  - Suspected radicular pain, based on radiation of pain along the dermatome (sensory distribution) of a nerve and/or  - Neurogenic claudication and/or  -Low back pain, NPRS = 3/10 (moderate to severe pain) associated with significant impairment of activities of daily living (ADLs) and one of the following:                      - substantial imaging abnormalityies such as a central disc herniation,                  -  severe degenerative disc disease or central spinal stenosis.                  - Failure of four weeks (counting from onset of pain) of non-surgical, non-injection care, which includes appropriate oral medication(s) and physical therapy to the extent tolerated.                          - Exceptions to the 4 week wait may include:                             - pain from Herpes Zoster                            - at least moderate pain with significant functional loss at work or home.                           - severe pain unresponsive to outpatient medical management.  - inability to tolerate non-surgical, non-injection care due to co-existing medical condition(s)  - prior successful injections for same specific condition with relief of at least 3 months? duration.

## 2020-09-22 DIAGNOSIS — G4733 Obstructive sleep apnea (adult) (pediatric): Secondary | ICD-10-CM

## 2020-09-22 DIAGNOSIS — M357 Hypermobility syndrome: Secondary | ICD-10-CM

## 2020-09-22 DIAGNOSIS — Z791 Long term (current) use of non-steroidal anti-inflammatories (NSAID): Secondary | ICD-10-CM

## 2020-09-22 DIAGNOSIS — Z79899 Other long term (current) drug therapy: Secondary | ICD-10-CM

## 2020-09-22 DIAGNOSIS — M419 Scoliosis, unspecified: Secondary | ICD-10-CM

## 2020-09-22 DIAGNOSIS — M47816 Spondylosis without myelopathy or radiculopathy, lumbar region: Secondary | ICD-10-CM

## 2020-09-22 DIAGNOSIS — M47812 Spondylosis without myelopathy or radiculopathy, cervical region: Secondary | ICD-10-CM

## 2020-09-22 DIAGNOSIS — M4802 Spinal stenosis, cervical region: Secondary | ICD-10-CM

## 2020-09-22 DIAGNOSIS — M5126 Other intervertebral disc displacement, lumbar region: Secondary | ICD-10-CM

## 2020-09-27 NOTE — Progress Notes
L/m covid screening

## 2020-09-28 ENCOUNTER — Encounter: Admit: 2020-09-28 | Payer: PRIVATE HEALTH INSURANCE | Attending: Anesthesiology | Primary: Pediatrics

## 2020-09-28 ENCOUNTER — Inpatient Hospital Stay: Admit: 2020-09-28 | Discharge: 2020-09-28 | Payer: BLUE CROSS/BLUE SHIELD | Primary: Pediatrics

## 2020-09-28 ENCOUNTER — Ambulatory Visit: Admit: 2020-09-28 | Payer: PRIVATE HEALTH INSURANCE | Primary: Pediatrics

## 2020-09-28 DIAGNOSIS — M5416 Radiculopathy, lumbar region: Secondary | ICD-10-CM

## 2020-09-28 MED ORDER — IOHEXOL 240 MG IODINE/ML INTRAVENOUS SOLUTION
240 mg iodine/mL | Freq: Once | EPIDURAL | Status: DC
Start: 2020-09-28 — End: 2020-09-28

## 2020-09-28 MED ORDER — BUPIVACAINE (PF) 0.25 % (2.5 MG/ML) INJECTION SOLUTION
0.252.5 % (2.5 mg/mL) | Freq: Once | EPIDURAL | Status: DC
Start: 2020-09-28 — End: 2020-09-28

## 2020-09-28 MED ORDER — METHYLPREDNISOLONE ACETATE 40 MG/ML SUSPENSION FOR INJECTION
40 mg/mL | Freq: Once | EPIDURAL | Status: DC
Start: 2020-09-28 — End: 2020-09-28

## 2020-09-28 MED ORDER — SODIUM BICARBONATE 1 MEQ/ML (8.4 %) INTRAVENOUS SOLUTION
8.4 % | Status: CP
Start: 2020-09-28 — End: ?

## 2020-09-28 MED ORDER — SODIUM CHLORIDE 0.9 % (FLUSH) INJECTION SYRINGE
0.9 % | Status: CP
Start: 2020-09-28 — End: ?

## 2020-09-28 NOTE — Discharge Instructions
[facility] PAIN MANAGEMENT AMB DISCHARGE PATIENT PLANPOST-PROCEDURE INSTRUCTIONSPrescription: No?	Rest today and take it easy. Stay off your feet as much as possible today. No strenuous exercise, heavy lifting or labor-intensive activities. You may resume normal activities, as tolerated, tomorrow.?	Medication. Use Tylenol (not aspirin) or your pain medication for discomfort at the injection site. Aspirin can cause bleeding. You may also use ice for soreness over the injection site (with at least a paper towel between the ice and your skin). Apply the ice for 30 minutes, then leave off for 30 minutes - repeat this cycle as necessary.?	You may remove the Band-Aid dressing and bathe in your normal manner tonight or tomorrow.?	Please note, if a steroid was used today, it may cause some facial flushing (redness) or puffiness as a side effect. This is NOT considred an allergic reaction. If it occurs, it is temporary and will subside, usually in a few days.?	A nurse from our office will call you in approximately 3-5 days to follow-up on your response to the procedure. (if you do not hear from [place] please feel free to cal l [place] at [phone]).You give permission to leave a message: On answering machine / voice mail.  Special instructions: NoFOLLOW-UP PLANCALL FOR FOLLOW-UP AS NEEDED. Continue to see your primary doctor and/or the doctor who referred you to our office for pain management. Special Instructions: No?	If you do not have someone available you must  Notify us. The physician reserves the right to cancel the procedure if you do not have a ride.?	Your comfort is important to [place]. Please call our office if you have any questions or concerns at [phone] during normal business hours.?	As always, if you are having a pain management emergency, please call the [facility] Operator at [phone] and ask that they page the anesthesiologist on call.  Discharge Date: 09/28/2020               Discharge Time 1430 PMDisposition:  HomeNurse Signature: Gala Romney, RN

## 2020-09-28 NOTE — Other
1415 received pt from procedure, vss pt denies pain to injection site. Dressing to back clean dry intact. Discharge instructions given pt verbalized understanding copy sent with pt.[id number]  Pt discharged with dad, via ambulation gait steady

## 2020-09-28 NOTE — Procedures
Subsequent to this admission for the caudal epi procedure, I have personally reassessed the patient by examination, review of relevant data pertaining to the planned procedure. I have verified the planned procedure and there have been no relevant changes since the last history and physical which was less than 30 days ago.Physical Exam:Constitutional: The patient is oriented to person, place, and time. The patient appears well-developed and well-nourished. HENT: Head: Normocephalic and atraumatic. Eyes: EOM are normal. Pupils are equal, roundNeck: Normal range of motion. Neck supple. Pulmonary/Chest: Effort normal, CTABCVS: +S1,S2, RRRAbdominal: Soft. Neurological: The patient is alert and oriented to person, place, and time. Skin: Skin is warm and dry. Psychiatric: The patient has a normal mood and affect. ANESTHESIA HISTORYNo personal or significant family history of anesthesia problemsASA Status: ASA 2: Patient with other illness that does not limit life styleMALLAMPATI SCOREClass 2: Upper half of tonsil fossa visibleElectronically Signed by Frederich Cha, MD, September 28, 2020

## 2020-09-28 NOTE — Anesthesia Procedure Notes
2:09 PM Procedure : Caudal Steroid Injection Diagnosis: DDD, DJD Lumbar SpinePrep: PHISOHEX/ALCOHOLSite: Other caudalFluoroscope Used?: Yes Position: PronePositional Aids: Pillows   Dressing: BandaidInstructions for follow up care giving to patient? Eston Esters by: Dr. Lorel Monaco

## 2020-09-28 NOTE — Procedures
Procedure NoteDate of procedure: 09/28/2020 Procedure:                              Caudal epidural steroid injection under fluoroscopy                                                Fluoroscopic needle guidance                                               Preoperative Diagnosis:         Lumbar radiculopathy                                                Postoperative Diagnosis:        Lumbar radiculopathy                                                 Surgeon: Frederich Cha, MD  Anesthesia: MAC EBL: ZeroFindings: NoneSpecimen: None After risks and benefits were explained including bleeding, infection, worsening of the pain, damage to the area being injected, nerve damage, spinal cord damage, paralysis, meningitis, arachnoiditis, allergic reaction to medication and spinal headache, all questions were answered and a signed consent was obtained.   Next, the patient was brought to the procedure room and placed in the prone position. Standard ASA monitors were placed. Support pillows were placed under the head and the abdomen. The back and sacrococcygeal region were prepped with chlorhexidine and draped in the usual sterile fashion and sterile technique was adhered to throughout the entire procedure. The skin and subcutaneous tissue overlying the sacrococcygeal ligament was anesthetized with 3 cc of 1% lidocaine. Using fluoroscopic guidance in the lateral and AP projection, a #25 gauge quincke needle was advanced through the sacrococcygeal ligament into the caudal canal. This was accomplished easily. There was no evidence of CSF, paresthesia, or heme. 1 cc of omnipaque 240 mg/ml was given confirming placement of the needle in the epidural space and with contrast spread up to L5/S1. The patient then received a mixture of 80 mg of depo-medrol, 8 cc of PF Saline lidocaine through the needle. There was no evidence of CSF, paresthesia or heme. The needle was then removed. A Band-Aid was applied over the site. After the procedure, the patient was brought back to the recovery area in stable condition. The patient tolerated the procedure well. There was no neurological deficits post procedure. The patient went to the recovery room in stable condition. Discharge instructions were given to the patient. Frederich Cha, MD was present for the entire procedure. Total fluoroscopic time: 12 seconds

## 2020-09-28 NOTE — Discharge Summary
PAIN MANAGEMENT DISCHARGE NOTEBenjamin Conway is status post  . Travis Conway tolerated procedure well and is being discharged to home.Frederich Cha, MD2/15/2022

## 2020-09-28 NOTE — Other
Two nurse verification of: patient identification, site marking and consent.  Reviewed with pain procedure RN

## 2020-09-28 NOTE — Patient Instructions
Procedure Discharge Instructions1.	You have had a caudal epidural.2.	Do not drive or operate any machinery today.3.	Do not do any strenuous activity today.4.	You may resume your normal diet.5.	You may return to work on: 02/16/226.	Do not shower or use any warm-hot water over the area covered by the procedure for 8-12 hours.7.	It is common for you to experience an increase in pain for up to 72 hours following the procedure. We recommend that you put ice on the area for the first 24 hours. After 24 hours you may use moist heat is needed. Call the [facility] if you continue to experience any increase in pain. If you have an acute emergency please call [phone].	A follow up appointment is necessary. Please call the [facility] at [phone] to schedule your follow-up appointment.9.	You may notice the following symptoms after your nerve block.a.	Muscle weakness in your legs, arm, hands.b.	Numbness in your abdominal area.c.	Tingling and/or a warm sensation in your legs, arms.d.	Your knees may buckle due to muscle weakness. This may last 2-4 hours following the procedure.e.	Dizziness; if, so lie down. The procedure you have may have decreased your blood pressure. This should last only a short period of time. Sit up slowly, the stand. If this does not resolve within 30 minutes seek medical assistance.f.	If you experience any new onset shortness of breath or pain when breathing, go to the nearest emergency room and tell them you have just had a nerve block. They will need to evaluate you for respiratory problems.g.	Drooping eyelids, blood shot eyes, increased tearing, nasal stuffiness, hoarse voice or the sensation of a lump in your throat may occur following certain procedures. They should start to resolve in 3-8 hours. Wait until you voice is normal before eating or drinking. Start with a sip of water and progress to solid as tolerated.h.	A tingling sensation in your arms or hands or your legs may occur. They should start to resolve in 3-8 hours.10.	Please monitor the site for bleeding, signs of infections, increased swelling.11.	Please report any new increasing numbness in your arms or legs once initial numbness has resolved.

## 2020-09-29 NOTE — Telephone Encounter
Left follow up message

## 2021-01-13 IMAGING — MR MR LUMBAR SPINE W/O CM
5 series · 31 of 48 positions shown · non-contrast
Comparison: None available.

CLINICAL DATA: Initial evaluation for central and bilateral lower
back pain with bilateral right greater than left buttock, hip, and
leg pain, occasional right foot numbness.

EXAM:
MRI LUMBAR SPINE WITHOUT CONTRAST
TECHNIQUE: Multiplanar, multisequence MR imaging of the lumbar spine was
performed. No intravenous contrast was administered.

[Series 5: T2 · sagittal · 4.0mm · 0.94mm/px · 7 of 17 slices shown (1 of 2)]
[im 1/17]
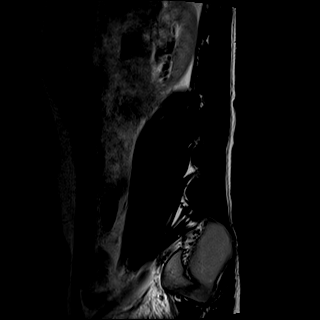
[im 3/17]
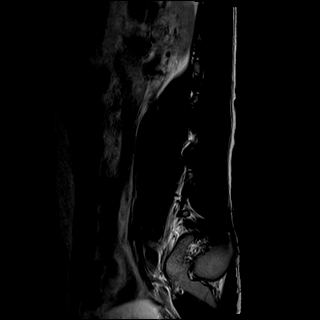
[im 6/17]
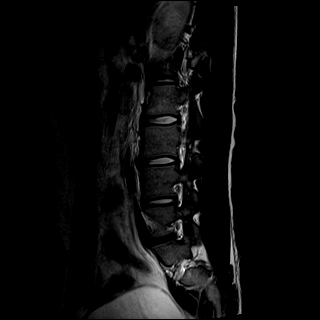
[im 9/17]
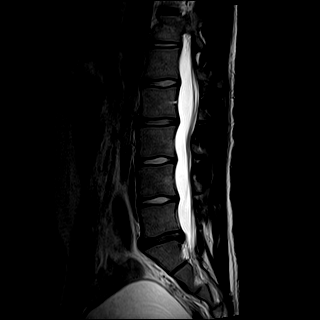
[im 11/17]
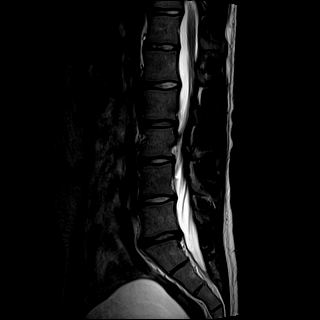
[im 14/17]
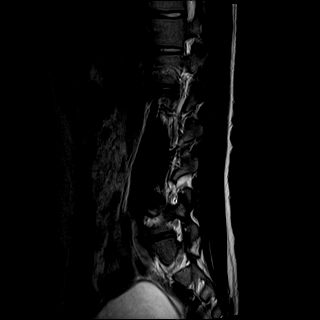
[im 17/17]
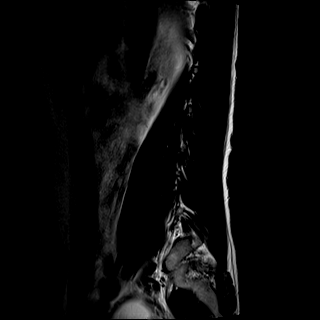

[Series 6: T1 · sagittal · 4.0mm · 0.94mm/px · 7 of 17 slices shown (1 of 2)]
[im 1/17]
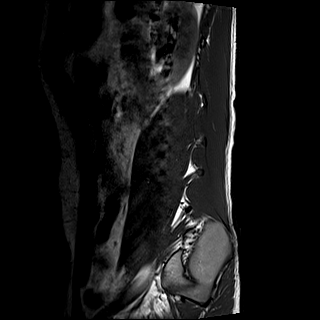
[im 3/17]
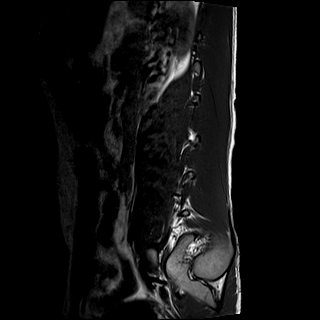
[im 6/17]
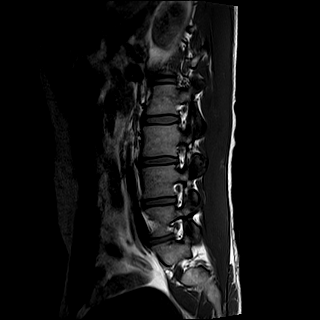
[im 9/17]
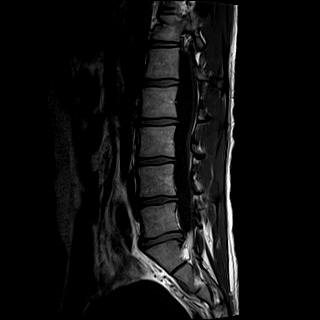
[im 11/17]
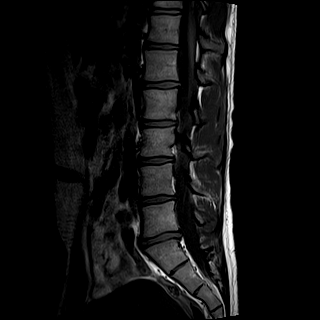
[im 14/17]
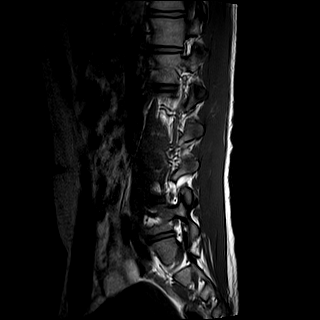
[im 17/17]
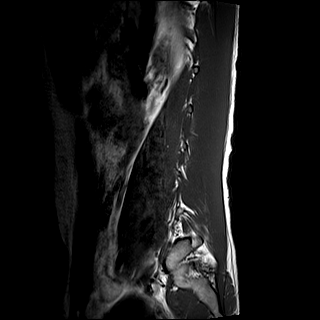

[Series 7: STIR · sagittal · 4.0mm · 0.47mm/px · 1 of 17 slices shown]
[im 1/17]
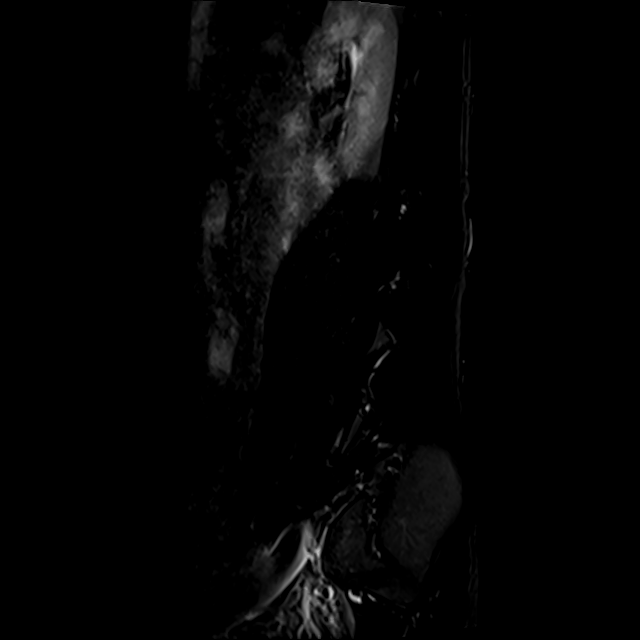

[Series 8: T2 · axial · 4.0mm · 0.78mm/px · z∈[-47,+162]mm · 8 of 38 slices shown (2 of 2)]
[im 1/38]
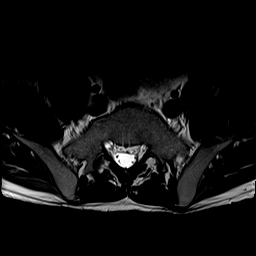
[im 6/38]
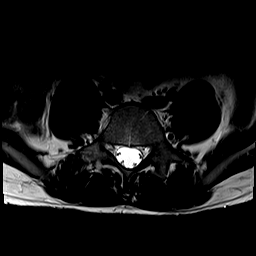
[im 12/38]
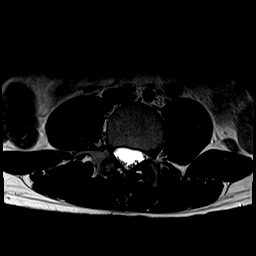
[im 18/38]
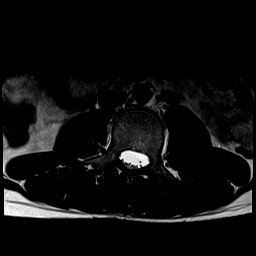
[im 20/38]
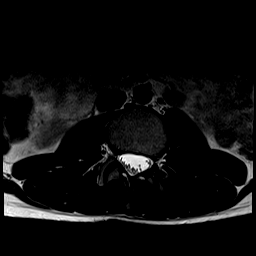
[im 26/38]
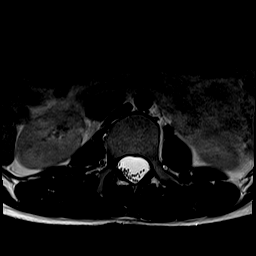
[im 32/38]
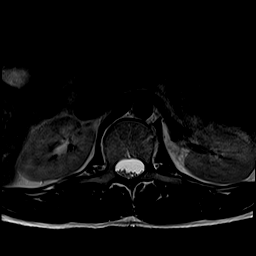
[im 38/38]
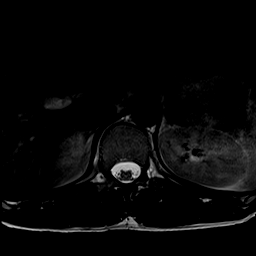

[Series 9: T1 · axial · 4.0mm · 0.39mm/px · z∈[-47,+162]mm · 8 of 38 slices shown (2 of 2)]
[im 1/38]
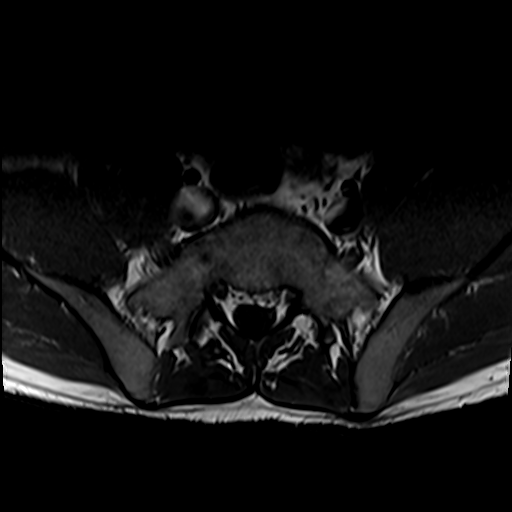
[im 6/38]
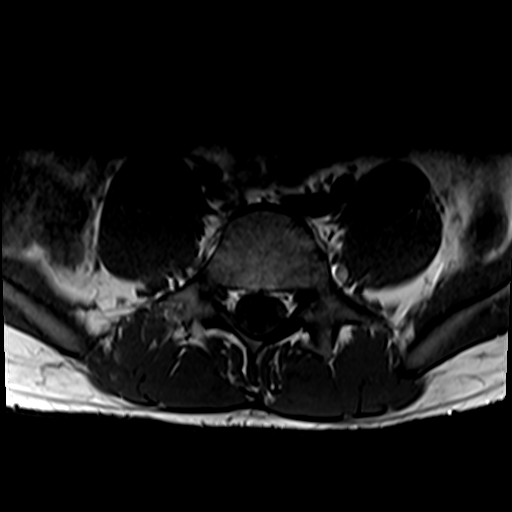
[im 12/38]
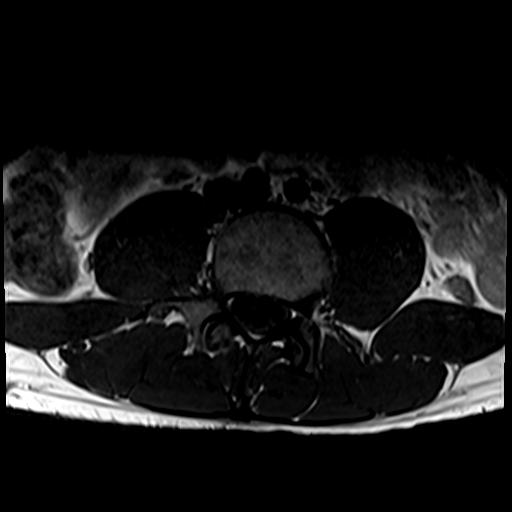
[im 18/38]
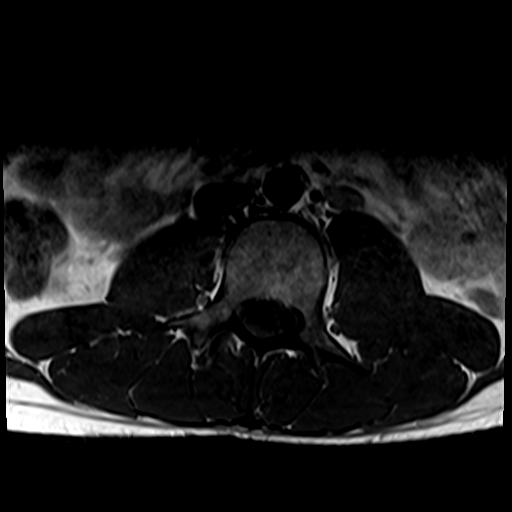
[im 20/38]
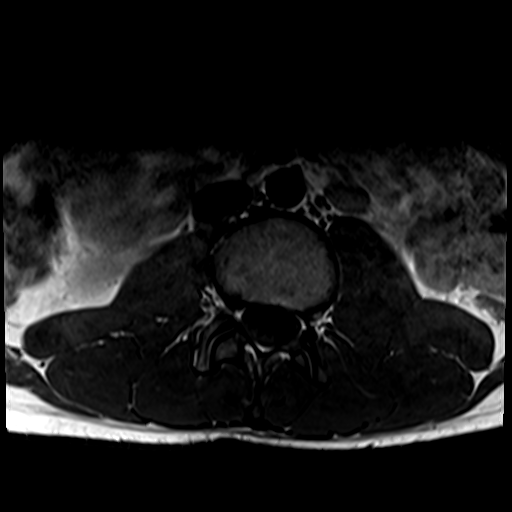
[im 26/38]
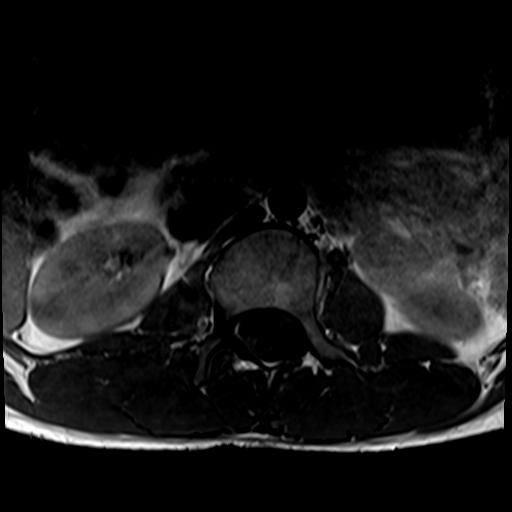
[im 32/38]
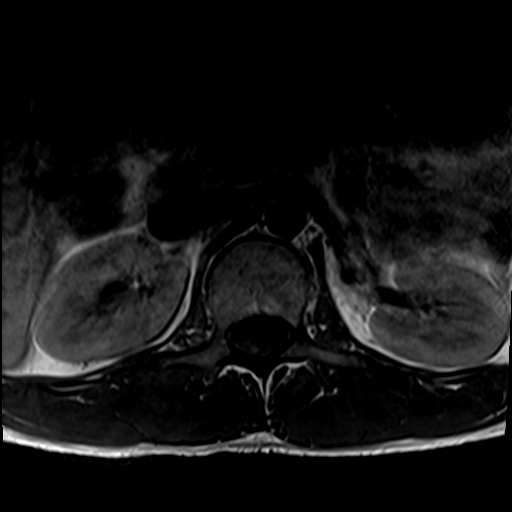
[im 38/38]
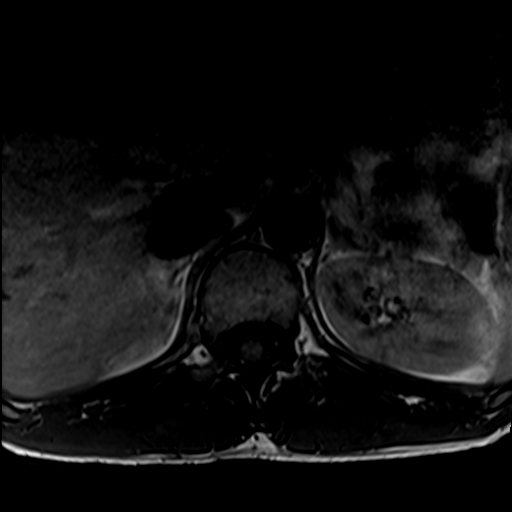

[31 of 48 positions shown; findings below may reference images not displayed]

FINDINGS: Segmentation: Standard. Lowest well-formed disc space labeled the
L5-S1 level.

Alignment: Levoscoliosis. Straightening of the normal lumbar
lordosis. Trace 2 mm retrolisthesis of L5 on S1.

Vertebrae: Vertebral body height maintained without evidence for
acute or chronic fracture. Bone marrow signal intensity within
normal limits. No discrete or worrisome osseous lesions. No abnormal
marrow edema.

Conus medullaris and cauda equina: Conus extends to the L1 level.
Conus and cauda equina appear normal.

Paraspinal and other soft tissues: Paraspinous soft tissues within
normal limits. Partially visualized urinary bladder moderately
distended. Visualized visceral structures otherwise unremarkable.

Disc levels:

L1-2:  Unremarkable.

L2-3: Disc desiccation with mild intervertebral disc space
narrowing. Mild diffuse disc bulge. No canal or foraminal stenosis.

L3-4: Negative interspace. Mild bilateral facet hypertrophy. No
significant canal or foraminal stenosis.

L4-5: Negative interspace. Mild bilateral facet hypertrophy. No
canal or foraminal stenosis.

L5-S1: Chronic intervertebral disc space narrowing with disc
desiccation and mild diffuse disc bulge. Shallow broad-based left
subarticular disc protrusion with slight inferior angulation (series
5, image 9). Associated annular fissure. Protruding disc encroaches
upon the left lateral recess and mildly displaces the descending
left S1 nerve root (series 9, image 36). Mild left lateral recess
stenosis. Central canal remains widely patent. No foraminal
encroachment.
IMPRESSION: 1. Shallow left subarticular disc protrusion at L5-S1, contacting
and mildly displacing the descending left S1 nerve root as it
courses through the left lateral recess.
2. Mild noncompressive disc bulging at L2-3 without stenosis or
impingement.
3. Underlying levoscoliosis.
